# Patient Record
Sex: Male | Born: 1965
Health system: Southern US, Community
[De-identification: ages and names within clinical notes are randomized; demographics above are authoritative.]

---

## 2001-10-12 ENCOUNTER — Encounter: Payer: Self-pay | Admitting: Emergency Medicine

## 2001-10-12 ENCOUNTER — Emergency Department (HOSPITAL_COMMUNITY): Admission: EM | Admit: 2001-10-12 | Discharge: 2001-10-13 | Payer: Self-pay | Admitting: Emergency Medicine

## 2011-01-06 ENCOUNTER — Ambulatory Visit (INDEPENDENT_AMBULATORY_CARE_PROVIDER_SITE_OTHER): Payer: BC Managed Care – PPO

## 2011-01-06 DIAGNOSIS — J111 Influenza due to unidentified influenza virus with other respiratory manifestations: Secondary | ICD-10-CM

## 2011-01-06 DIAGNOSIS — IMO0001 Reserved for inherently not codable concepts without codable children: Secondary | ICD-10-CM

## 2011-01-07 ENCOUNTER — Emergency Department (HOSPITAL_COMMUNITY)
Admission: EM | Admit: 2011-01-07 | Discharge: 2011-01-07 | Discharge status: Against Medical Advice | Payer: BC Managed Care – PPO | Attending: Emergency Medicine | Admitting: Emergency Medicine

## 2011-01-07 DIAGNOSIS — R5383 Other fatigue: Secondary | ICD-10-CM | POA: Insufficient documentation

## 2011-01-07 DIAGNOSIS — R109 Unspecified abdominal pain: Secondary | ICD-10-CM | POA: Insufficient documentation

## 2011-01-07 DIAGNOSIS — R5381 Other malaise: Secondary | ICD-10-CM | POA: Insufficient documentation

## 2011-01-07 LAB — URINALYSIS, ROUTINE W REFLEX MICROSCOPIC
Glucose, UA: NEGATIVE mg/dL
Hgb urine dipstick: NEGATIVE
Ketones, ur: 15 mg/dL — AB
Protein, ur: 30 mg/dL — AB
Urobilinogen, UA: 0.2 mg/dL (ref 0.0–1.0)

## 2011-01-07 NOTE — ED Notes (Addendum)
To ed for eval of right flank pain and feeling weak since starting meds prescribed by [facility] for flu and back pain. Masked at triage

## 2011-01-07 NOTE — ED Notes (Signed)
Called 3 x no answer

## 2011-02-05 ENCOUNTER — Ambulatory Visit (INDEPENDENT_AMBULATORY_CARE_PROVIDER_SITE_OTHER): Payer: BC Managed Care – PPO

## 2011-02-05 DIAGNOSIS — IMO0002 Reserved for concepts with insufficient information to code with codable children: Secondary | ICD-10-CM

## 2011-02-05 DIAGNOSIS — R609 Edema, unspecified: Secondary | ICD-10-CM

## 2011-02-07 ENCOUNTER — Ambulatory Visit (INDEPENDENT_AMBULATORY_CARE_PROVIDER_SITE_OTHER): Payer: BC Managed Care – PPO

## 2011-02-07 DIAGNOSIS — L2089 Other atopic dermatitis: Secondary | ICD-10-CM

## 2011-02-07 DIAGNOSIS — L299 Pruritus, unspecified: Secondary | ICD-10-CM

## 2012-08-18 ENCOUNTER — Ambulatory Visit (INDEPENDENT_AMBULATORY_CARE_PROVIDER_SITE_OTHER): Payer: BC Managed Care – PPO | Admitting: Emergency Medicine

## 2012-08-18 VITALS — BP 110/80 | HR 80 | Temp 98.0°F | Resp 16 | Ht 72.0 in | Wt 165.0 lb

## 2012-08-18 DIAGNOSIS — S4381XA Sprain of other specified parts of right shoulder girdle, initial encounter: Secondary | ICD-10-CM

## 2012-08-18 DIAGNOSIS — IMO0002 Reserved for concepts with insufficient information to code with codable children: Secondary | ICD-10-CM

## 2012-08-18 MED ORDER — CYCLOBENZAPRINE HCL 10 MG PO TABS: 10.0000 mg | ORAL_TABLET | Freq: Three times a day (TID) | ORAL | Status: DC | PRN

## 2012-08-18 MED ORDER — NAPROXEN SODIUM 550 MG PO TABS: 550.0000 mg | ORAL_TABLET | Freq: Two times a day (BID) | ORAL | Status: DC

## 2012-08-18 NOTE — Patient Instructions (Addendum)
Shoulder Pain  The shoulder is the joint that connects your arms to your body. The bones that form the shoulder joint include the upper arm bone (humerus), the shoulder blade (scapula), and the collarbone (clavicle). The top of the humerus is shaped like a ball and fits into a rather flat socket on the scapula (glenoid cavity). A combination of muscles and strong, fibrous tissues that connect muscles to bones (tendons) support your shoulder joint and hold the ball in the socket. Small, fluid-filled sacs (bursae) are located in different areas of the joint. They act as cushions between the bones and the overlying soft tissues and help reduce friction between the gliding tendons and the bone as you move your arm. Your shoulder joint allows a wide range of motion in your arm. This range of motion allows you to do things like scratch your back or throw a ball. However, this range of motion also makes your shoulder more prone to pain from overuse and injury.  Causes of shoulder pain can originate from both injury and overuse and usually can be grouped in the following four categories:   Redness, swelling, and pain (inflammation) of the tendon (tendinitis) or the bursae (bursitis).   Instability, such as a dislocation of the joint.   Inflammation of the joint (arthritis).   Broken bone (fracture).  HOME CARE INSTRUCTIONS    Apply ice to the sore area.   Put ice in a plastic bag.   Place a towel between your skin and the bag.   Leave the ice on for 15-20 minutes, 3-4 times per day for the first 2 days.   Stop using cold packs if they do not help with the pain.   If you have a shoulder sling or immobilizer, wear it as long as your caregiver instructs. Only remove it to shower or bathe. Move your arm as little as possible, but keep your hand moving to prevent swelling.   Squeeze a soft ball or foam pad as much as possible to help prevent swelling.   Only take over-the-counter or prescription medicines for pain,  discomfort, or fever as directed by your caregiver.  SEEK MEDICAL CARE IF:    Your shoulder pain increases, or new pain develops in your arm, hand, or fingers.   Your hand or fingers become cold and numb.   Your pain is not relieved with medicines.  SEEK IMMEDIATE MEDICAL CARE IF:    Your arm, hand, or fingers are numb or tingling.   Your arm, hand, or fingers are significantly swollen or turn white or blue.  MAKE SURE YOU:    Understand these instructions.   Will watch your condition.   Will get help right away if you are not doing well or get worse.  Document Released: 10/23/2004 Document Revised: 10/08/2011 Document Reviewed: 12/28/2010  [employer] Patient Information 2014 [employer]

## 2012-08-18 NOTE — Progress Notes (Signed)
[facility] [address] [place] [address] [phone]  Date:  08/18/2012   Name:  FLYNN GWYN   DOB:  20-Feb-1965   MRN:  [id number]  PCP:  No primary provider on file.    Chief Complaint: Shoulder Pain   History of Present Illness:  Derek Avery is a 47 y.o. very pleasant male patient who presents with the following:  Pain in posterior shoulder since vacation that radiates into his lateral upper arm distal to the deltoid and into his right thumb at times.  No history of injury or overuse.  Similar pain in December and January that spontaneously went away.  No improvement with over the counter medications or other home remedies. Denies other complaint or health concern today.   There are no active problems to display for this patient.   History reviewed. No pertinent past medical history.  History reviewed. No pertinent past surgical history.  History  Substance Use Topics  . Smoking status: Current Every Day Smoker -- 0.50 packs/day    Types: Cigarettes  . Smokeless tobacco: Not on file  . Alcohol Use: No    History reviewed. No pertinent family history.  Allergies  Allergen Reactions  . Codeine Itching and Nausea And Vomiting    Medication list has been reviewed and updated.  Current Outpatient Prescriptions on File Prior to Visit  Medication Sig Dispense Refill  . meloxicam (MOBIC) 7.5 MG tablet Take 7.5 mg by mouth daily.        . traMADol (ULTRAM) 50 MG tablet Take 50 mg by mouth every 6 (six) hours as needed. For pain.  Maximum dose= 8 tablets per day        No current facility-administered medications on file prior to visit.    Review of Systems:  As per HPI, otherwise negative.    Physical Examination: Filed Vitals:   08/18/12 1758  BP: 110/80  Pulse: 80  Temp: 98 F (36.7 C)  Resp: 16   Filed Vitals:   08/18/12 1758  Height: 6' (1.829 m)  Weight: 165 lb (74.844 kg)   Body mass index is 22.37  kg/(m^2). Ideal Body Weight: Weight in (lb) to have BMI = 25: 183.9   GEN: WDWN, NAD, Non-toxic, Alert & Oriented x 3 HEENT: Atraumatic, Normocephalic.  Ears and Nose: No external deformity. EXTR: No clubbing/cyanosis/edema NEURO: Normal gait.  PSYCH: Normally interactive. Conversant. Not depressed or anxious appearing.  Calm demeanor.  RIGHT shoulder.  Tender trigger point posterior shoulder medial and superior to angle of scapula.  Neuro intact.  Assessment and Plan: Scapulocostal syndrome Local heat Anaprox Flexeril   Signed,  Phillips Odor, MD

## 2013-03-27 ENCOUNTER — Ambulatory Visit (INDEPENDENT_AMBULATORY_CARE_PROVIDER_SITE_OTHER): Payer: BC Managed Care – PPO | Admitting: Family Medicine

## 2013-03-27 VITALS — BP 120/90 | HR 85 | Temp 98.4°F | Resp 18 | Ht 72.0 in | Wt 169.0 lb

## 2013-03-27 DIAGNOSIS — R040 Epistaxis: Secondary | ICD-10-CM

## 2013-03-27 DIAGNOSIS — J3489 Other specified disorders of nose and nasal sinuses: Secondary | ICD-10-CM

## 2013-03-27 DIAGNOSIS — J019 Acute sinusitis, unspecified: Secondary | ICD-10-CM

## 2013-03-27 DIAGNOSIS — R0981 Nasal congestion: Secondary | ICD-10-CM

## 2013-03-27 MED ORDER — FLUTICASONE PROPIONATE 50 MCG/ACT NA SUSP: 2.0000 | Freq: Every day | NASAL | Status: AC

## 2013-03-27 MED ORDER — AMOXICILLIN-POT CLAVULANATE 875-125 MG PO TABS: 1.0000 | ORAL_TABLET | Freq: Two times a day (BID) | ORAL | Status: DC

## 2013-03-27 NOTE — Progress Notes (Signed)
      Chief Complaint:  Chief Complaint  Patient presents with  . Sinusitis    x 3 weeks. C/O headache, facial pressure, nosebleed, & congestion    HPI: Derek Avery is a 48 y.o. male who is here for  3 week history of sinus pressure , facial pressure, spont nose bleeds every other day, he can get it to stop pretty quickly. He is not using anything for these sxs, has been blowing his nose a lot. He denies fevers or chills, no chest pain, minimal dry cough, + daily smoker. No Sob or wheezing. + seasonal allergies but no asthma.   History reviewed. No pertinent past medical history. History reviewed. No pertinent past surgical history. History   Social History  . Marital Status: Single    Spouse Name: N/A    Number of Children: N/A  . Years of Education: N/A   Social History Main Topics  . Smoking status: Current Every Day Smoker -- 0.50 packs/day    Types: Cigarettes  . Smokeless tobacco: None  . Alcohol Use: No  . Drug Use: No  . Sexual Activity: Yes   Other Topics Concern  . None   Social History Narrative  . None   Family History  Problem Relation Age of Onset  . Heart disease Mother   . Hyperlipidemia Mother   . Hypertension Sister    Allergies  Allergen Reactions  . Tramadol Hcl Nausea And Vomiting  . Codeine Itching and Nausea And Vomiting   Prior to Admission medications   Not on File     ROS: The patient denies fevers, chills, night sweats, unintentional weight loss, chest pain, palpitations, wheezing, dyspnea on exertion, nausea, vomiting, abdominal pain, dysuria, hematuria, melena, numbness, weakness, or tingling.  All other systems have been reviewed and were otherwise negative with the exception of those mentioned in the HPI and as above.    PHYSICAL EXAM: Filed Vitals:   03/27/13 1554  BP: 120/90  Pulse: 85  Temp: 98.4 F (36.9 C)  Resp: 18   Filed Vitals:   03/27/13 1554  Height: 6' (1.829 m)  Weight: 169 lb (76.658 kg)   Body  mass index is 22.92 kg/(m^2).  General: Alert, no acute distress HEENT:  Normocephalic, atraumatic, oropharynx patent. EOMI, PERRLA. TM nl, + sinus tenderness, no exudates Cardiovascular:  Regular rate and rhythm, no rubs murmurs or gallops.  No Carotid bruits, radial pulse intact. No pedal edema.  Respiratory: Clear to auscultation bilaterally.  No wheezes, rales, or rhonchi.  No cyanosis, no use of accessory musculature GI: No organomegaly, abdomen is soft and non-tender, positive bowel sounds.  No masses. Skin: No rashes. Neurologic: Facial musculature symmetric. Psychiatric: Patient is appropriate throughout our interaction. Lymphatic: No cervical lymphadenopathy Musculoskeletal: Gait intact.   LABS:    EKG/XRAY:   [facility]   ASSESSMENT/PLAN: Encounter Diagnoses  Name Primary?  . Acute sinusitis Yes  . Epistaxis   . Nasal congestion    Rx Augmentin Rx Flonase when he does not have nose bleeds Warm compresses F/u prn  Gross sideeffects, risk and benefits, and alternatives of medications d/w patient. Patient is aware that all medications have potential sideeffects and we are unable to predict every sideeffect or drug-drug interaction that may occur.  Hamilton CapriLE,  PHUONG, DO 03/27/2013 4:23 PM

## 2013-03-27 NOTE — Patient Instructions (Signed)

## 2019-10-18 ENCOUNTER — Other Ambulatory Visit: Payer: Self-pay | Admitting: Internal Medicine

## 2019-10-18 ENCOUNTER — Ambulatory Visit
Admission: RE | Admit: 2019-10-18 | Discharge: 2019-10-18 | Disposition: A | Discharge status: Home / Self Care | Payer: 59 | Source: Ambulatory Visit | Attending: Internal Medicine | Admitting: Internal Medicine

## 2019-10-18 DIAGNOSIS — M5489 Other dorsalgia: Secondary | ICD-10-CM

## 2019-10-18 DIAGNOSIS — R0789 Other chest pain: Secondary | ICD-10-CM

## 2021-01-23 ENCOUNTER — Ambulatory Visit: Payer: Self-pay

## 2021-01-23 ENCOUNTER — Other Ambulatory Visit: Payer: Self-pay

## 2021-01-23 ENCOUNTER — Ambulatory Visit
Admission: EM | Admit: 2021-01-23 | Discharge: 2021-01-23 | Disposition: A | Discharge status: Home / Self Care | Payer: BC Managed Care – PPO | Attending: Urgent Care | Admitting: Urgent Care

## 2021-01-23 ENCOUNTER — Ambulatory Visit (INDEPENDENT_AMBULATORY_CARE_PROVIDER_SITE_OTHER): Payer: 59

## 2021-01-23 DIAGNOSIS — R059 Cough, unspecified: Secondary | ICD-10-CM | POA: Diagnosis not present

## 2021-01-23 DIAGNOSIS — R0789 Other chest pain: Secondary | ICD-10-CM

## 2021-01-23 DIAGNOSIS — J069 Acute upper respiratory infection, unspecified: Secondary | ICD-10-CM | POA: Diagnosis not present

## 2021-01-23 DIAGNOSIS — F17200 Nicotine dependence, unspecified, uncomplicated: Secondary | ICD-10-CM

## 2021-01-23 MED ORDER — CETIRIZINE HCL 10 MG PO TABS: 10.0000 mg | ORAL_TABLET | Freq: Every day | ORAL | 0 refills | Status: AC

## 2021-01-23 MED ORDER — BENZONATATE 100 MG PO CAPS: 100.0000 mg | ORAL_CAPSULE | Freq: Three times a day (TID) | ORAL | 0 refills | Status: AC | PRN

## 2021-01-23 MED ORDER — PREDNISONE 50 MG PO TABS: 50.0000 mg | ORAL_TABLET | Freq: Every day | ORAL | 0 refills | Status: AC

## 2021-01-23 MED ORDER — PROMETHAZINE-DM 6.25-15 MG/5ML PO SYRP: 5.0000 mL | ORAL_SOLUTION | Freq: Every evening | ORAL | 0 refills | Status: AC | PRN

## 2021-01-23 NOTE — ED Provider Notes (Signed)
[facility]   MRN: [id number] DOB: [birth year]  Subjective:   Derek Avery is a 55 y.o. male presenting for 5-day history of persistent coughing, chest tightness, sinus congestion.  Symptoms worse when he lays down.  No fever, throat pain, ear pain, chest pain, wheezing.  Patient is concerned for pneumonia.  Smokes 1ppd..  No history of asthma or COPD.  Patient would like a chest x-ray.  Patient did a COVID test when he started having symptoms and was negative.  Does not want a repeat.  No current facility-administered medications for this encounter.  Current Outpatient Medications:    fluticasone (FLONASE) 50 MCG/ACT nasal spray, Place 2 sprays into both nostrils daily. Do not use if you have bloody nose, Disp: 16 g, Rfl: 0   Allergies  Allergen Reactions   Tramadol Hcl Nausea And Vomiting   Codeine Itching and Nausea And Vomiting   History reviewed. No pertinent past medical history.   History reviewed. No pertinent surgical history.  Family History  Problem Relation Age of Onset   Heart disease Mother    Hyperlipidemia Mother    Hypertension Sister     Social History   Tobacco Use   Smoking status: Every Day    Packs/day: 0.50    Types: Cigarettes  Substance Use Topics   Alcohol use: No   Drug use: No    ROS   Objective:   Vitals: BP 112/78    Pulse 70    Temp 98.6 F (37 C)    Resp 20    SpO2 97%   Physical Exam Constitutional:      General: He is not in acute distress.    Appearance: Normal appearance. He is well-developed. He is not ill-appearing, toxic-appearing or diaphoretic.  HENT:     Head: Normocephalic and atraumatic.     Right Ear: External ear normal.     Left Ear: External ear normal.     Nose: Congestion present. No rhinorrhea.     Mouth/Throat:     Mouth: Mucous membranes are moist.     Pharynx: No oropharyngeal exudate or posterior oropharyngeal erythema.  Eyes:     General: No scleral icterus.       Right eye: No  discharge.        Left eye: No discharge.     Extraocular Movements: Extraocular movements intact.     Conjunctiva/sclera: Conjunctivae normal.     Pupils: Pupils are equal, round, and reactive to light.  Cardiovascular:     Rate and Rhythm: Normal rate and regular rhythm.     Heart sounds: Normal heart sounds. No murmur heard.   No friction rub. No gallop.  Pulmonary:     Effort: Pulmonary effort is normal. No respiratory distress.     Breath sounds: Normal breath sounds. No stridor. No wheezing, rhonchi or rales.  Neurological:     Mental Status: He is alert and oriented to person, place, and time.  Psychiatric:        Mood and Affect: Mood normal.        Behavior: Behavior normal.        Thought Content: Thought content normal.     Assessment and Plan :   PDMP not reviewed this encounter.  1. Viral URI with cough   2. Chest tightness   3. Smoker    X-ray over-read was pending at time of discharge, recommended follow up with only abnormal results. Otherwise will not call for negative over-read. Patient  was in agreement.  In light of his smoking, recommended an oral prednisone course, albuterol inhaler.  Otherwise will manage for viral respiratory illness.  He declined COVID and flu testing. Counseled patient on potential for adverse effects with medications prescribed/recommended today, ER and return-to-clinic precautions discussed, patient verbalized understanding.    Wallis Bamberg, PA-C 01/23/21 1147

## 2021-01-23 NOTE — ED Triage Notes (Signed)
Pt presents with  nasal congestion and cough  since Friday, negative covid test, states he feels chest tightness that is worse with laying down, concerned with pneumonia

## 2022-07-10 IMAGING — DX DG CHEST 2V
2 series · 2 of 2 positions shown · non-contrast
Comparison: Two-view chest x-ray 10/17/2020.

CLINICAL DATA: Persistent cough. Nasal congestion. Patient smokes.

EXAM:
CHEST - 2 VIEW

[chest pa]
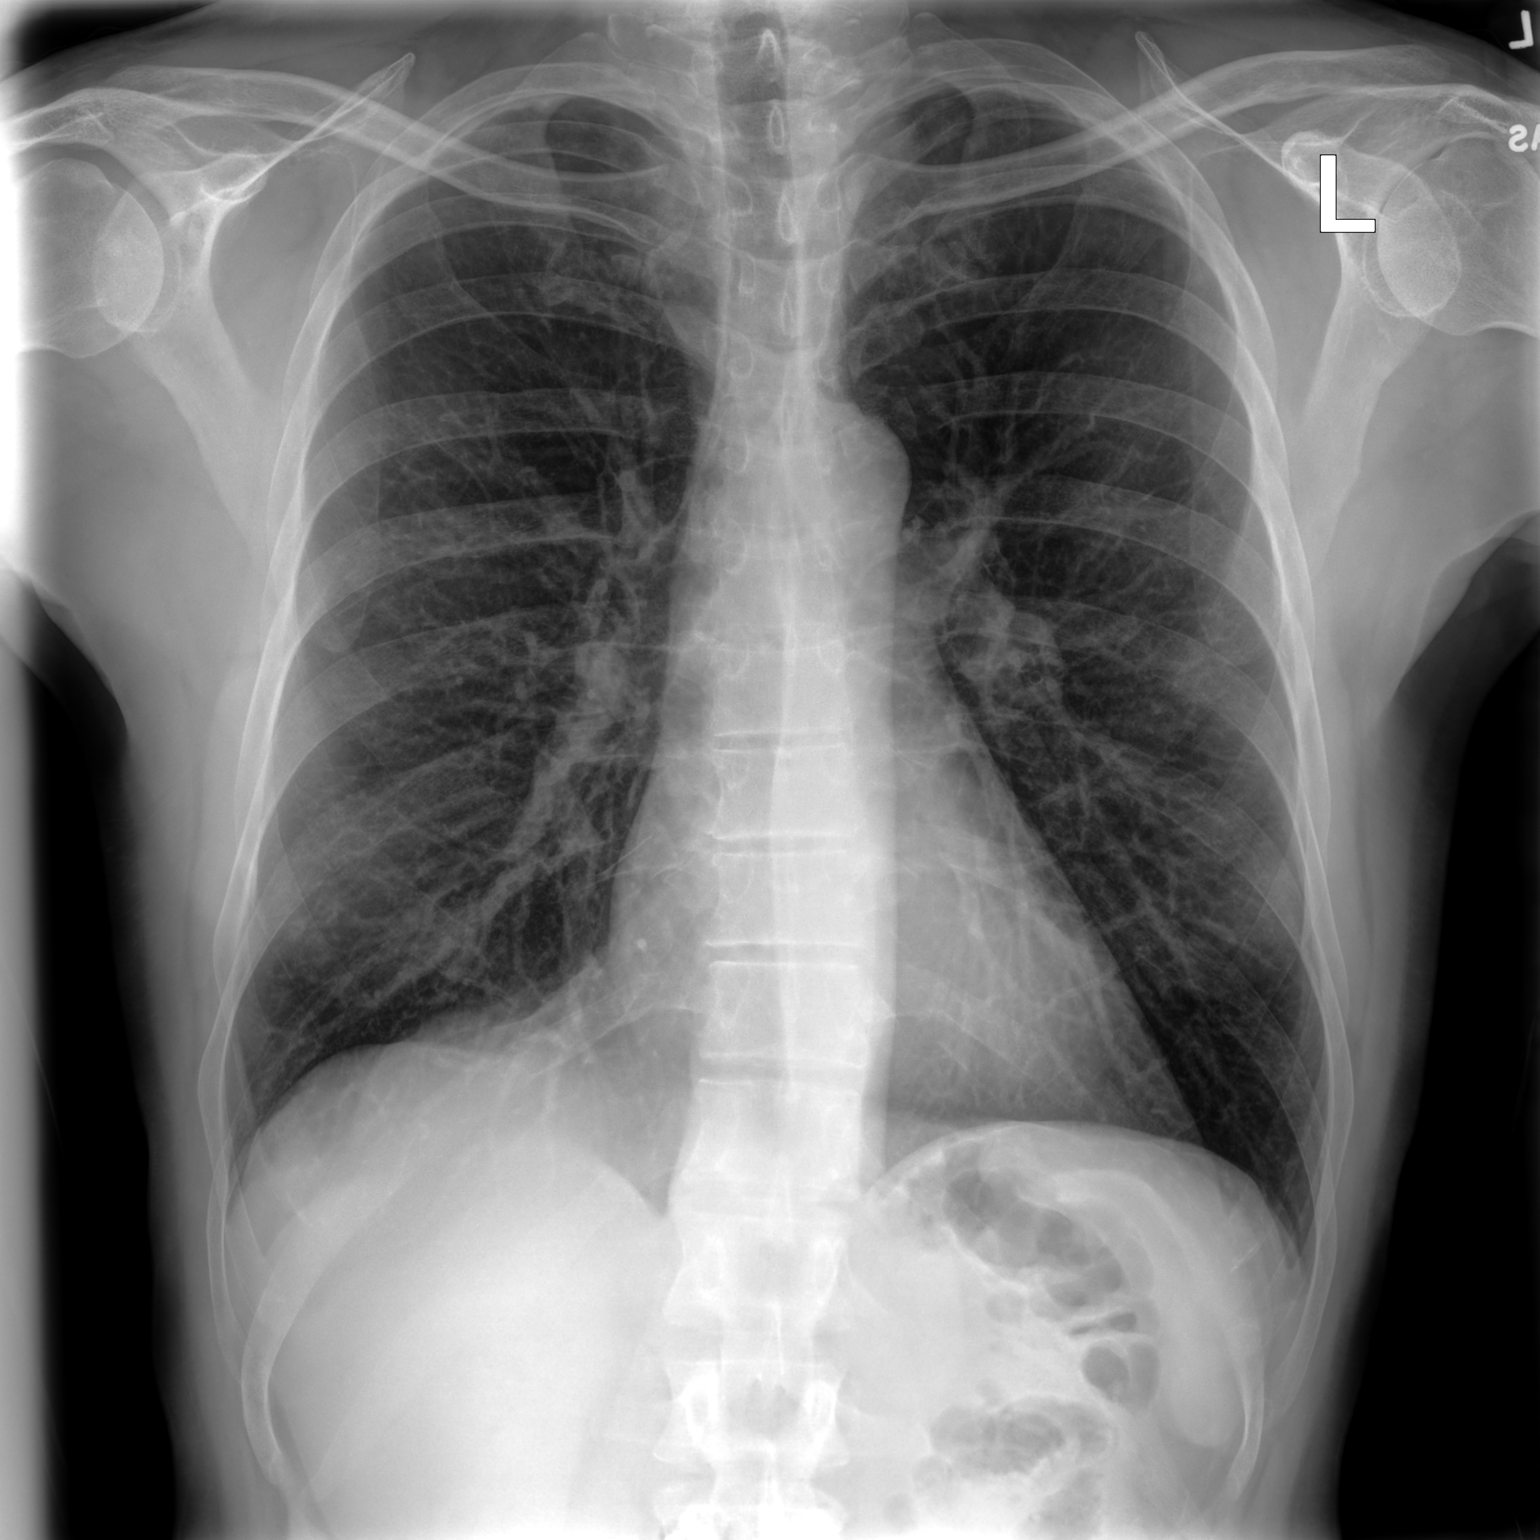

[chest lat]
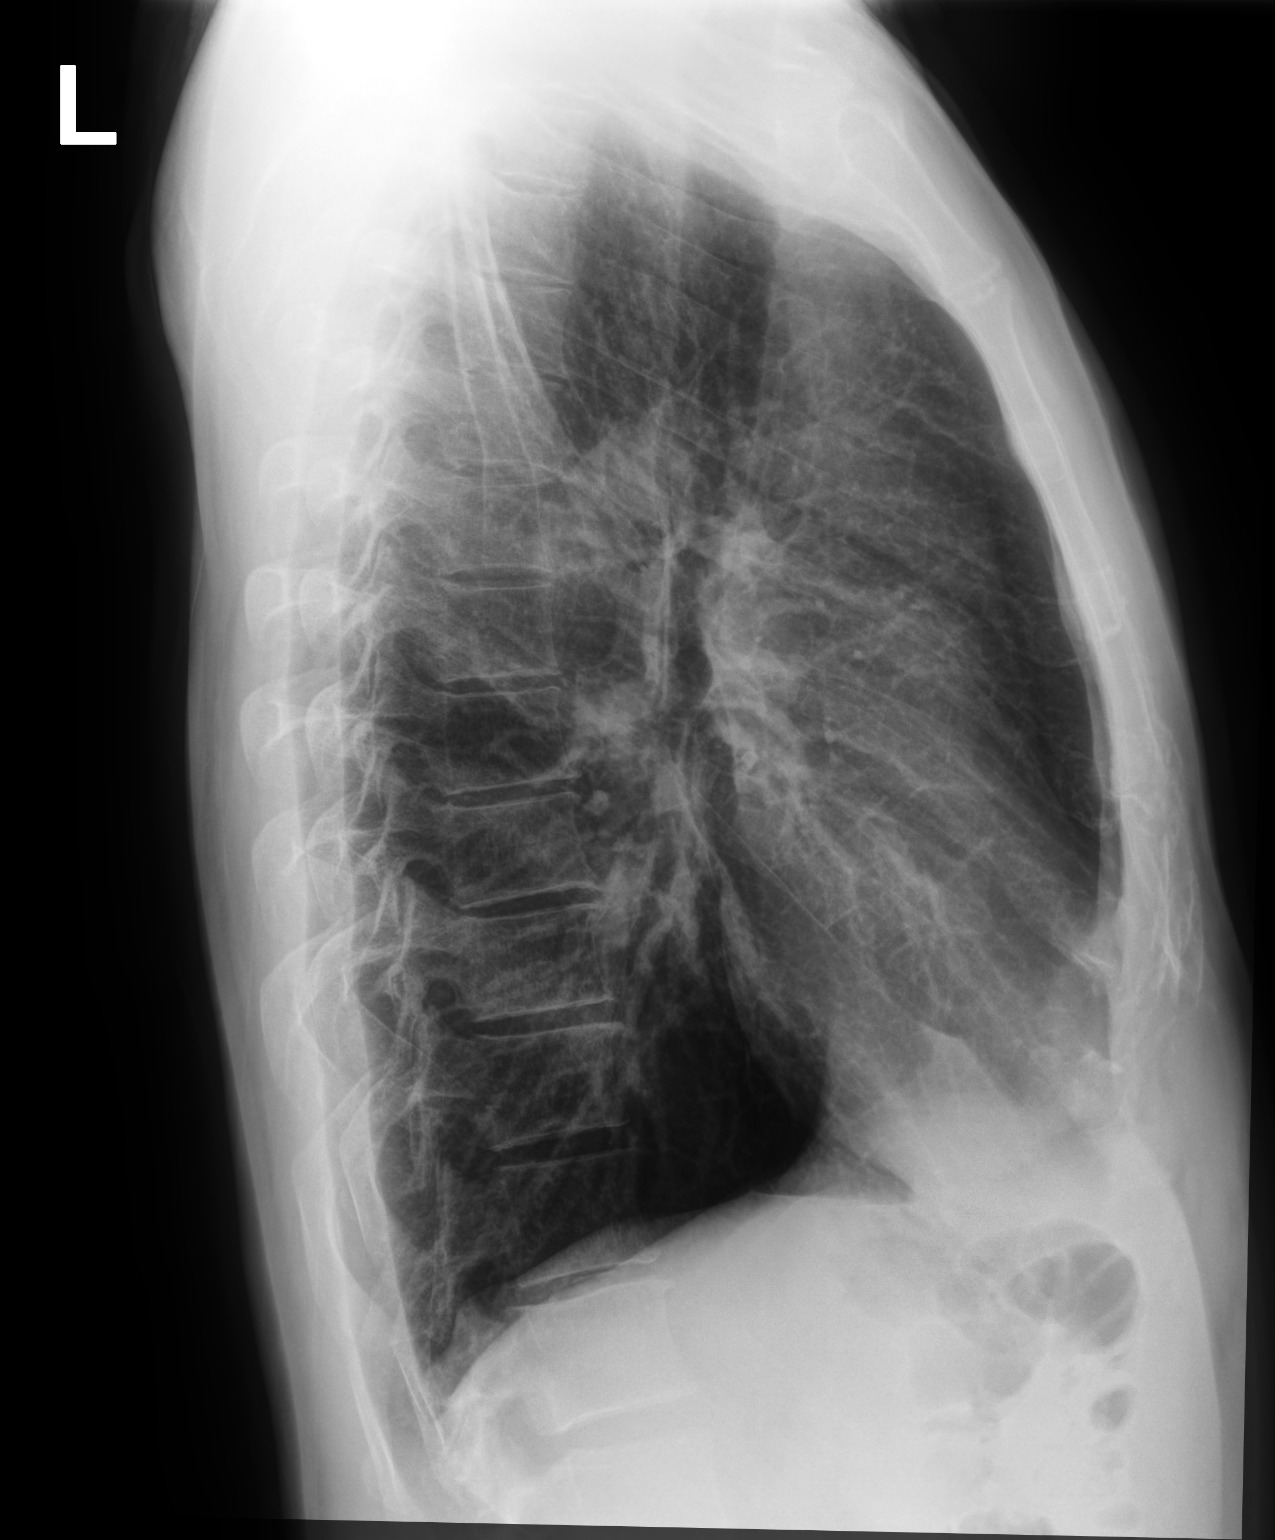

[2 of 2 positions shown; findings below may reference images not displayed]

FINDINGS: Heart size is within normal limits. No edema or effusion is present.
Changes of COPD noted. Lungs are mildly hyperinflated.
IMPRESSION: No acute cardiopulmonary disease.
